# Patient Record
Sex: Female | Born: 1975 | Hispanic: No | Marital: Married | Smoking: Never smoker
Health system: Southern US, Community
[De-identification: ages and names within clinical notes are randomized; demographics above are authoritative.]

---

## 2018-03-05 ENCOUNTER — Emergency Department (HOSPITAL_COMMUNITY): Payer: BLUE CROSS/BLUE SHIELD

## 2018-03-05 ENCOUNTER — Observation Stay (HOSPITAL_COMMUNITY)
Admission: EM | Admit: 2018-03-05 | Discharge: 2018-03-07 | Disposition: A | Discharge status: Home / Self Care | Payer: BLUE CROSS/BLUE SHIELD | Attending: Internal Medicine | Admitting: Internal Medicine

## 2018-03-05 DIAGNOSIS — F418 Other specified anxiety disorders: Secondary | ICD-10-CM | POA: Insufficient documentation

## 2018-03-05 DIAGNOSIS — R109 Unspecified abdominal pain: Secondary | ICD-10-CM | POA: Diagnosis not present

## 2018-03-05 DIAGNOSIS — R7989 Other specified abnormal findings of blood chemistry: Secondary | ICD-10-CM | POA: Diagnosis not present

## 2018-03-05 DIAGNOSIS — R1013 Epigastric pain: Secondary | ICD-10-CM | POA: Diagnosis not present

## 2018-03-05 DIAGNOSIS — R55 Syncope and collapse: Secondary | ICD-10-CM | POA: Diagnosis not present

## 2018-03-05 DIAGNOSIS — R778 Other specified abnormalities of plasma proteins: Secondary | ICD-10-CM

## 2018-03-05 NOTE — ED Triage Notes (Signed)
Pt presents to ED via [facility] for syncope. Pt was with family and passed out outside a shopping center. Pt has been responsive to voice, but not talking.
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
## 2018-03-06 ENCOUNTER — Observation Stay (HOSPITAL_COMMUNITY): Payer: BLUE CROSS/BLUE SHIELD

## 2018-03-06 ENCOUNTER — Emergency Department (HOSPITAL_COMMUNITY): Payer: BLUE CROSS/BLUE SHIELD

## 2018-03-06 ENCOUNTER — Other Ambulatory Visit: Payer: Self-pay

## 2018-03-06 ENCOUNTER — Encounter (HOSPITAL_COMMUNITY): Payer: Self-pay

## 2018-03-06 DIAGNOSIS — R55 Syncope and collapse: Secondary | ICD-10-CM | POA: Diagnosis not present

## 2018-03-06 DIAGNOSIS — F418 Other specified anxiety disorders: Secondary | ICD-10-CM | POA: Diagnosis not present

## 2018-03-06 DIAGNOSIS — R748 Abnormal levels of other serum enzymes: Secondary | ICD-10-CM

## 2018-03-06 DIAGNOSIS — R7989 Other specified abnormal findings of blood chemistry: Secondary | ICD-10-CM | POA: Diagnosis not present

## 2018-03-06 DIAGNOSIS — R1013 Epigastric pain: Secondary | ICD-10-CM | POA: Diagnosis not present

## 2018-03-06 DIAGNOSIS — R109 Unspecified abdominal pain: Secondary | ICD-10-CM | POA: Diagnosis not present

## 2018-03-06 DIAGNOSIS — R778 Other specified abnormalities of plasma proteins: Secondary | ICD-10-CM | POA: Insufficient documentation

## 2018-03-06 LAB — CBC WITH DIFFERENTIAL/PLATELET
BASOS ABS: 0 10*3/uL (ref 0.0–0.1)
BASOS PCT: 0 %
EOS ABS: 0.2 10*3/uL (ref 0.0–0.7)
EOS PCT: 3 %
HEMATOCRIT: 38.3 % (ref 36.0–46.0)
Hemoglobin: 12.8 g/dL (ref 12.0–15.0)
Lymphocytes Relative: 24 %
Lymphs Abs: 1.9 10*3/uL (ref 0.7–4.0)
MCH: 27.4 pg (ref 26.0–34.0)
MCHC: 33.4 g/dL (ref 30.0–36.0)
MCV: 82 fL (ref 78.0–100.0)
MONO ABS: 0.4 10*3/uL (ref 0.1–1.0)
MONOS PCT: 5 %
NEUTROS ABS: 5.2 10*3/uL (ref 1.7–7.7)
Neutrophils Relative %: 68 %
PLATELETS: 286 10*3/uL (ref 150–400)
RBC: 4.67 MIL/uL (ref 3.87–5.11)
RDW: 14.1 % (ref 11.5–15.5)
WBC: 7.7 10*3/uL (ref 4.0–10.5)

## 2018-03-06 LAB — D-DIMER, QUANTITATIVE: D-Dimer, Quant: <0.27 ug/mL-FEU (ref 0.00–0.50)

## 2018-03-06 LAB — HEPATIC FUNCTION PANEL
ALT: 21 U/L (ref 0–44)
AST: 17 U/L (ref 15–41)
Albumin: 3.9 g/dL (ref 3.5–5.0)
Alkaline Phosphatase: 80 U/L (ref 38–126)
Bilirubin, Direct: <0.1 mg/dL (ref 0.0–0.2)
TOTAL PROTEIN: 7 g/dL (ref 6.5–8.1)
Total Bilirubin: 0.5 mg/dL (ref 0.3–1.2)

## 2018-03-06 LAB — ECHOCARDIOGRAM COMPLETE
Height: 65 in
Weight: 3494.4 oz

## 2018-03-06 LAB — I-STAT CHEM 8, ED
BUN: 7 mg/dL (ref 6–20)
Calcium, Ion: 1.14 mmol/L — ABNORMAL LOW (ref 1.15–1.40)
Chloride: 107 mmol/L (ref 98–111)
Creatinine, Ser: 0.5 mg/dL (ref 0.44–1.00)
Glucose, Bld: 103 mg/dL — ABNORMAL HIGH (ref 70–99)
HEMATOCRIT: 37 % (ref 36.0–46.0)
HEMOGLOBIN: 12.6 g/dL (ref 12.0–15.0)
POTASSIUM: 3.5 mmol/L (ref 3.5–5.1)
SODIUM: 142 mmol/L (ref 135–145)
TCO2: 22 mmol/L (ref 22–32)

## 2018-03-06 LAB — TROPONIN I: Troponin I: <0.03 ng/mL (ref <0.03)

## 2018-03-06 LAB — I-STAT TROPONIN, ED
Troponin i, poc: 0.02 ng/mL (ref 0.00–0.08)
Troponin i, poc: 0.16 ng/mL (ref 0.00–0.08)

## 2018-03-06 LAB — I-STAT BETA HCG BLOOD, ED (MC, WL, AP ONLY): I-stat hCG, quantitative: <5 m[IU]/mL (ref <5)

## 2018-03-06 LAB — LIPASE, BLOOD: Lipase: 50 U/L (ref 11–51)

## 2018-03-06 MED ORDER — ONDANSETRON HCL 4 MG PO TABS: 4.0000 mg | ORAL_TABLET | Freq: Four times a day (QID) | ORAL | Status: DC | PRN

## 2018-03-06 MED ORDER — FENTANYL CITRATE (PF) 100 MCG/2ML IJ SOLN
50.0000 ug | Freq: Once | INTRAMUSCULAR | Status: AC
Start: 2018-03-06 — End: 2018-03-06
  Administered 2018-03-06: 50 ug via INTRAVENOUS
  Filled 2018-03-06: qty 2

## 2018-03-06 MED ORDER — SODIUM CHLORIDE 0.9 % IV SOLN
INTRAVENOUS | Status: DC
  Administered 2018-03-06: 75 mL/h via INTRAVENOUS

## 2018-03-06 MED ORDER — PANTOPRAZOLE SODIUM 40 MG PO TBEC
40.0000 mg | DELAYED_RELEASE_TABLET | Freq: Two times a day (BID) | ORAL | Status: DC
  Administered 2018-03-06 – 2018-03-07 (×3): 40 mg via ORAL
  Filled 2018-03-06 (×3): qty 1

## 2018-03-06 MED ORDER — SUCRALFATE 1 GM/10ML PO SUSP
1.0000 g | Freq: Three times a day (TID) | ORAL | Status: DC
  Filled 2018-03-06 (×3): qty 10

## 2018-03-06 MED ORDER — ASPIRIN EC 81 MG PO TBEC
81.0000 mg | DELAYED_RELEASE_TABLET | Freq: Every day | ORAL | Status: DC
  Administered 2018-03-07: 81 mg via ORAL
  Filled 2018-03-06: qty 1

## 2018-03-06 MED ORDER — ACETAMINOPHEN 650 MG RE SUPP: 650.0000 mg | Freq: Four times a day (QID) | RECTAL | Status: DC | PRN

## 2018-03-06 MED ORDER — IOPAMIDOL (ISOVUE-370) INJECTION 76%
INTRAVENOUS | Status: AC
Start: 2018-03-06 — End: 2018-03-06
  Filled 2018-03-06: qty 100

## 2018-03-06 MED ORDER — ONDANSETRON HCL 4 MG/2ML IJ SOLN
4.0000 mg | Freq: Once | INTRAMUSCULAR | Status: AC
  Administered 2018-03-06: 4 mg via INTRAVENOUS
  Filled 2018-03-06: qty 2

## 2018-03-06 MED ORDER — SODIUM CHLORIDE 0.9 % IV BOLUS: 1000.0000 mL | Freq: Once | INTRAVENOUS | Status: DC

## 2018-03-06 MED ORDER — ONDANSETRON HCL 4 MG/2ML IJ SOLN: 4.0000 mg | Freq: Four times a day (QID) | INTRAMUSCULAR | Status: DC | PRN

## 2018-03-06 MED ORDER — ENOXAPARIN SODIUM 40 MG/0.4ML ~~LOC~~ SOLN
40.0000 mg | SUBCUTANEOUS | Status: DC
  Filled 2018-03-06: qty 0.4

## 2018-03-06 MED ORDER — ACETAMINOPHEN 325 MG PO TABS: 650.0000 mg | ORAL_TABLET | Freq: Four times a day (QID) | ORAL | Status: DC | PRN

## 2018-03-06 MED ORDER — SODIUM CHLORIDE 0.9 % IV BOLUS
1000.0000 mL | Freq: Once | INTRAVENOUS | Status: AC
  Administered 2018-03-06: 1000 mL via INTRAVENOUS

## 2018-03-06 MED ORDER — IOPAMIDOL (ISOVUE-370) INJECTION 76%
100.0000 mL | Freq: Once | INTRAVENOUS | Status: AC | PRN
  Administered 2018-03-06: 100 mL via INTRAVENOUS

## 2018-03-06 NOTE — Progress Notes (Signed)
PT lying in bed, declines food and medicine. Won't get up in chair,unable to obtain orthostatics, pts husband stating she want to go home. MD to come and talk to pt. Alicia SoursAngela Tristen Luce

## 2018-03-06 NOTE — ED Notes (Signed)
Patient transported to CT 
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
## 2018-03-06 NOTE — Progress Notes (Signed)
PROGRESS NOTE    Alicia Anderson  RUE:[id number] DOB: [birth year] DOA: 03/05/2018 PCP: Yolanda Bonine, MD    Brief Narrative:  42 year old female who presented with syncope.  She does not have any significant past medical history.  She syncope episode while walking (10:00 pm), she had prodromal dizziness, no loss of continence or seizure-like activities during the event.  She was approximately 15 to 20 minutes unconsciousness.   She had a similar event as ago while standing while she had a fainting episode which was brief in duration compared to the one suffered on the day of admission.  Reports 3 days of worsening epigastric abdominal pain which has been constant sharp and burning in nature, moderate to severe intensity, worse with meals.  This has lead to significant decreased p.o. intake over the last 3 days.  She has mentions significant psychological stress at home over the last few months.   Patient does not formally exercise but is able to do her routine activities without any angina.  Walks at a normal pace, goes to the grocery store and climb steps without any symptoms.  On the initial physical examination blood pressure 146/88, rate 64, respirate 18, temperature 98.2, oxygen saturation 95%.  His membranes, lungs clear to auscultation bilaterally, heart S1-S2 present rhythmic, abdomen soft nontender, no lower extremity edema.  Sodium 142, potassium 3.5, chloride 107, glucose 103, BUN 7, creatinine 0.50, troponin 0.16, white count 7.7, hemoglobin 12.8, hematocrit 38.3, platelets 286, d-dimer less than 0.27.  CT chest and abdomen no acute intrathoracic, abdominal or pelvic pathology, no aortic aneurysm or dissection, no evidence of pulmonary embolus.  Chest x-ray negative for infiltrates.  EKG normal sinus rhythm, normal axis, normal intervals, no ST elevations or ST depressions, no significant T wave abnormalities.  Patient was admitted to the hospital working diagnosis of syncope complicated  by elevated troponin.  Assessment & Plan:   Principal Problem:   Syncope and collapse Active Problems:   Epigastric pain  1. Syncope complicated by elevated troponin (point of care). No further syncope episodes. Had prodromal dizziness before the event, had significant decreased po intake over last 3 days due to abdominal pain. She is physically active at home with no angina or heart failure symptoms. Increased phycological stress at home. EKG with normal sinus rhythm, with normal intervals and no Brugada criteria. Possible neurocardiogenic (vasovagal syncope), but elevated troponin warrants further testing with echocardiography and continue telemetry monitoring. Out of bed, ambulation and orthostatic vitals.   2. Abdominal pain due to gastritis. Will add antiacid therapy with bid proton pump inhibitors, sucralfate before meals, and advance diet as tolerated.  3. Anxiety and possible depression. Will need out patient follow up, no confusion or agitation.    DVT prophylaxis: enoxaparin   Code Status:  full Family Communication: I spoke with patient's husband at the bedside and all questions were addressed.  Disposition Plan/ discharge barriers: Plan to dc home in am if cardiac workup negative    Consultants:     Procedures:     Antimicrobials:       Subjective: Patient is feeling better, moderate abdominal pain in the epigastrium, no nausea or vomiting, no chest pain or dyspnea.   Objective: Vitals:   03/06/18 0400 03/06/18 0430 03/06/18 0600 03/06/18 0608  BP: 120/83 118/83  129/87  Pulse: 64 62  65  Resp: 19 14  (!) 22  Temp:    97.7 F (36.5 C)  TempSrc:    Oral  SpO2: 93% 92%  96%  Weight:   99.1 kg (218 lb 6.4 oz)   Height:   5\' 5"  (1.651 m)    No intake or output data in the 24 hours ending 03/06/18 0834 Filed Weights   03/06/18 0003 03/06/18 0600  Weight: 97.1 kg (214 lb) 99.1 kg (218 lb 6.4 oz)    Examination:   General: Not in pain or dyspnea,  deconditioned  Neurology: Awake and alert, non focal  E ENT: no pallor, no icterus, oral mucosa moist Cardiovascular: No JVD. S1-S2 present, rhythmic, no gallops, rubs, or murmurs. No lower extremity edema. Pulmonary: vesicular breath sounds bilaterally, adequate air movement, no wheezing, rhonchi or rales. Gastrointestinal. Abdomen with mild distention, no organomegaly, non tender, no rebound or guarding Skin. No rashes Musculoskeletal: no joint deformities     Data Reviewed: I have personally reviewed following labs and imaging studies  CBC: Recent Labs  Lab 03/06/18 0007 03/06/18 0014  WBC 7.7  --   NEUTROABS 5.2  --   HGB 12.8 12.6  HCT 38.3 37.0  MCV 82.0  --   PLT 286  --    Basic Metabolic Panel: Recent Labs  Lab 03/06/18 0014  NA 142  K 3.5  CL 107  GLUCOSE 103*  BUN 7  CREATININE 0.50   GFR: Estimated Creatinine Clearance: 107.8 mL/min (by C-G formula based on SCr of 0.5 mg/dL). Liver Function Tests: Recent Labs  Lab 03/06/18 0007  AST 17  ALT 21  ALKPHOS 80  BILITOT 0.5  PROT 7.0  ALBUMIN 3.9   Recent Labs  Lab 03/06/18 0007  LIPASE 50   No results for input(s): AMMONIA in the last 168 hours. Coagulation Profile: No results for input(s): INR, PROTIME in the last 168 hours. Cardiac Enzymes: No results for input(s): CKTOTAL, CKMB, CKMBINDEX, TROPONINI in the last 168 hours. BNP (last 3 results) No results for input(s): PROBNP in the last 8760 hours. HbA1C: No results for input(s): HGBA1C in the last 72 hours. CBG: No results for input(s): GLUCAP in the last 168 hours. Lipid Profile: No results for input(s): CHOL, HDL, LDLCALC, TRIG, CHOLHDL, LDLDIRECT in the last 72 hours. Thyroid Function Tests: No results for input(s): TSH, T4TOTAL, FREET4, T3FREE, THYROIDAB in the last 72 hours. Anemia Panel: No results for input(s): VITAMINB12, FOLATE, FERRITIN, TIBC, IRON, RETICCTPCT in the last 72 hours.    Radiology Studies: I have reviewed  all of the imaging during this hospital visit personally     Scheduled Meds: . aspirin EC  81 mg Oral Daily  . enoxaparin (LOVENOX) injection  40 mg Subcutaneous Q24H  . iopamidol       Continuous Infusions: . sodium chloride 75 mL/hr (03/06/18 0708)     LOS: 0 days        Sherlie Boyum Annett Gulaaniel Yajaira Doffing, MD [facility] Pager [phone]
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
## 2018-03-06 NOTE — Progress Notes (Signed)
  Echocardiogram 2D Echocardiogram has been performed.  Delcie RochENNINGTON, Omeed Osuna 03/06/2018, 4:51 PM
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
## 2018-03-06 NOTE — ED Notes (Signed)
Notified EDP,Palumbo,MD., pt. I-stat troponin results 0.16 and RN,Sullivan made aware.

## 2018-03-07 DIAGNOSIS — R55 Syncope and collapse: Secondary | ICD-10-CM | POA: Diagnosis not present

## 2018-03-07 DIAGNOSIS — R1013 Epigastric pain: Secondary | ICD-10-CM | POA: Diagnosis not present

## 2018-03-07 LAB — BASIC METABOLIC PANEL
Anion gap: 8 (ref 5–15)
BUN: <5 mg/dL — ABNORMAL LOW (ref 6–20)
CALCIUM: 8.5 mg/dL — AB (ref 8.9–10.3)
CO2: 24 mmol/L (ref 22–32)
CREATININE: 0.55 mg/dL (ref 0.44–1.00)
Chloride: 111 mmol/L (ref 98–111)
Glucose, Bld: 91 mg/dL (ref 70–99)
Potassium: 3.6 mmol/L (ref 3.5–5.1)
SODIUM: 143 mmol/L (ref 135–145)

## 2018-03-07 LAB — TROPONIN I: Troponin I: <0.03 ng/mL (ref <0.03)

## 2018-03-07 MED ORDER — SUCRALFATE 1 GM/10ML PO SUSP: 1.0000 g | Freq: Three times a day (TID) | ORAL | 0 refills | Status: AC

## 2018-03-07 MED ORDER — PANTOPRAZOLE SODIUM 40 MG PO TBEC: 40.0000 mg | DELAYED_RELEASE_TABLET | Freq: Every day | ORAL | 0 refills | Status: AC

## 2018-03-07 NOTE — Progress Notes (Signed)
Pt discharged in stable condition via wheelchair into the care of her husband via private vehicle.  PIV removed intact w/o S&S of complications.  Discharge instructions reviewed with pt/husband.  Pt/husband verbalized understanding. 
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
## 2018-03-07 NOTE — Plan of Care (Signed)
Adequate for discharge.

## 2020-03-10 IMAGING — CT CT ANGIO CHEST
5 of 12 series · 18 of 46 positions shown · IV contrast (ISOVUE)
Comparison: Chest radiograph dated 03/05/2018

CLINICAL DATA: 42-year-old female with syncope.

EXAM:
CT ANGIOGRAPHY CHEST
CT ABDOMEN AND PELVIS WITH CONTRAST
TECHNIQUE: Multidetector CT imaging of the chest was performed using the
standard protocol during bolus administration of intravenous
contrast. Multiplanar CT image reconstructions and MIPs were
obtained to evaluate the vascular anatomy. Multidetector CT imaging
of the abdomen and pelvis was performed using the standard protocol
during bolus administration of intravenous contrast.
CONTRAST:  100mL [id number] IOPAMIDOL ([id number]) INJECTION 76%

[Series 4: axial st · axial · 0.83mm/px · z∈[-706,-421]mm · 4 of 96 slices shown (1 of 2)]
[im 20/96  lung]
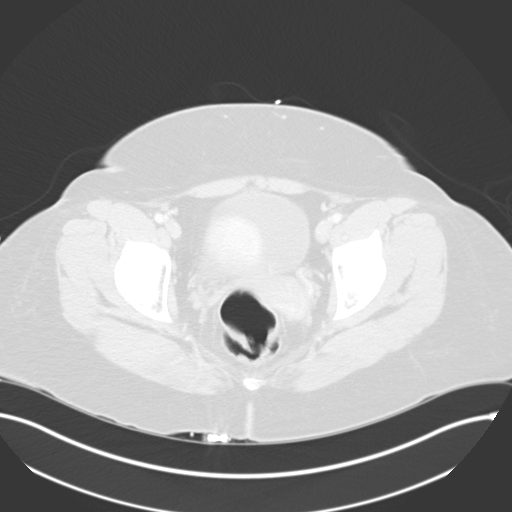
[im 39/96  soft-tissue]
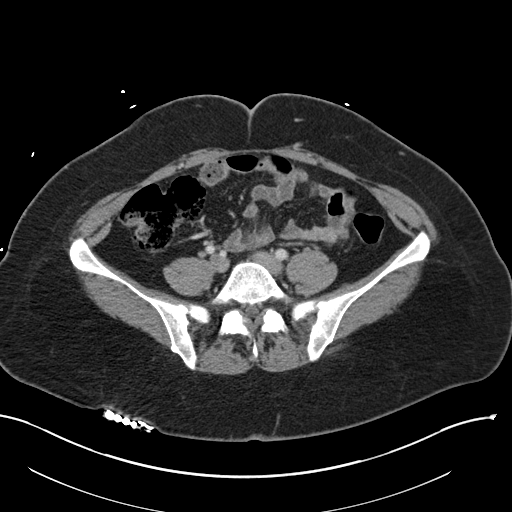
[im 58/96  lung]
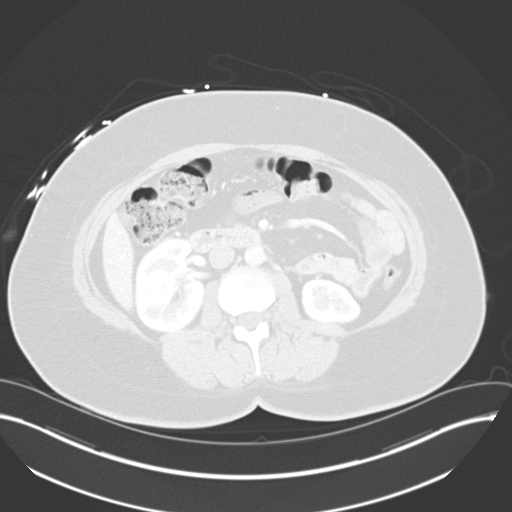
[im 77/96  soft-tissue]
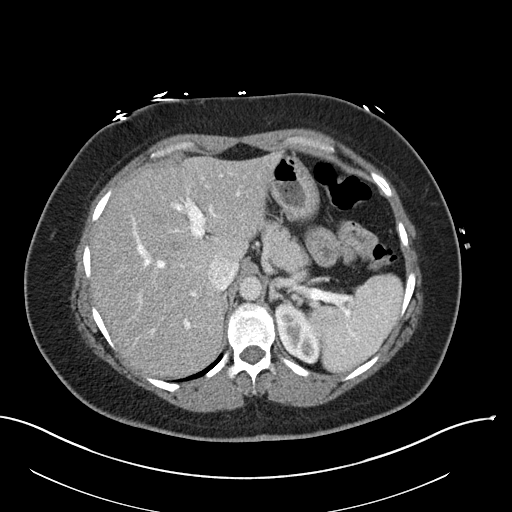

[Series 6: axial st · axial · 0.71mm/px · z∈[-348,-255]mm · 2 of 94 slices shown (2 of 2)]
[im 32/94  lung]
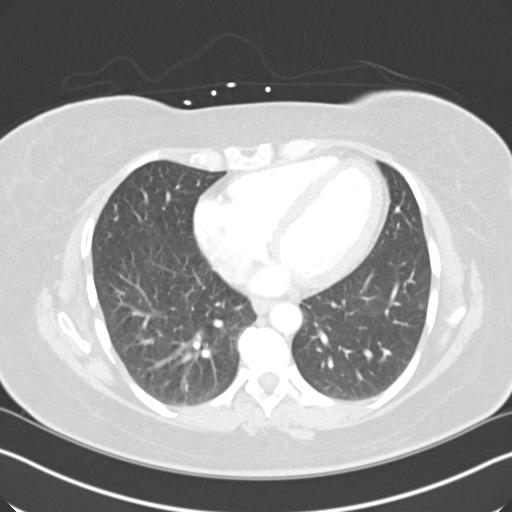
[im 63/94  lung]
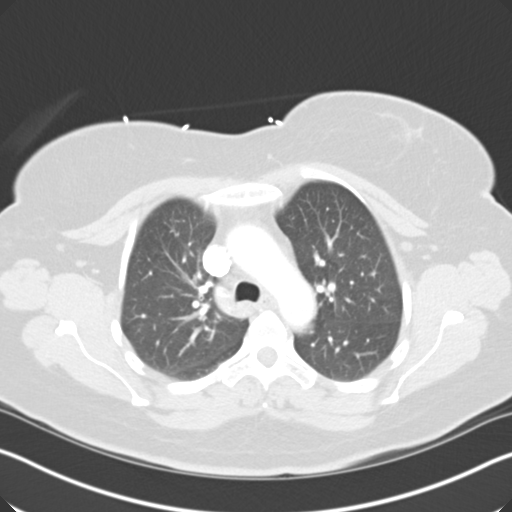

[Series 7: thins · axial · 0.71mm/px · z∈[-418,-186]mm · 8 of 280 slices shown]
[im 24/280  lung]
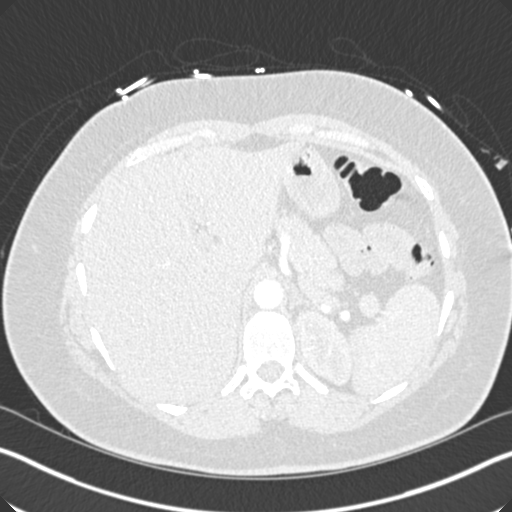
[im 70/280  lung]
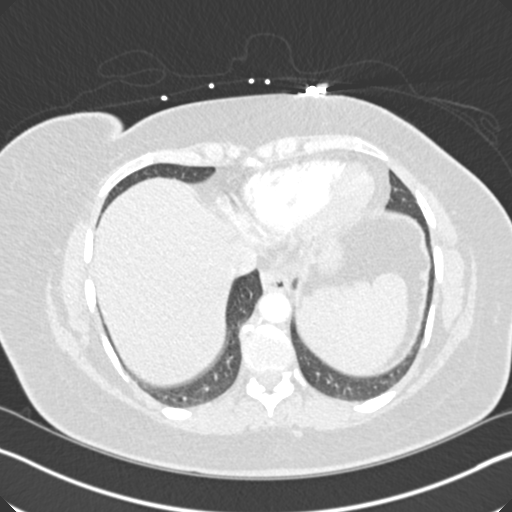
[im 94/280  lung]
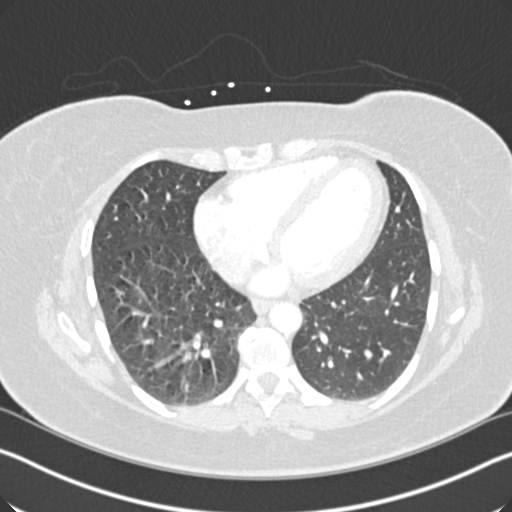
[im 117/280  lung]
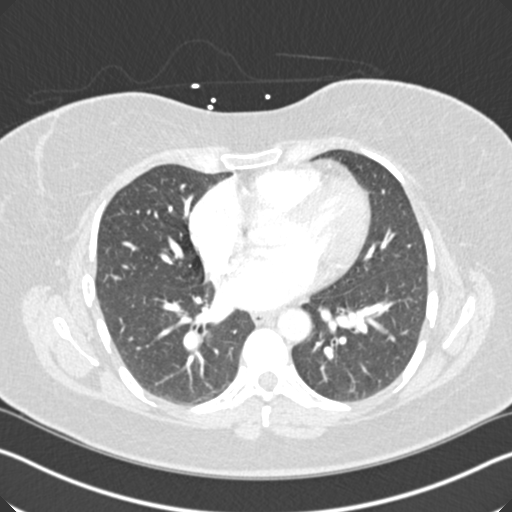
[im 163/280  lung]
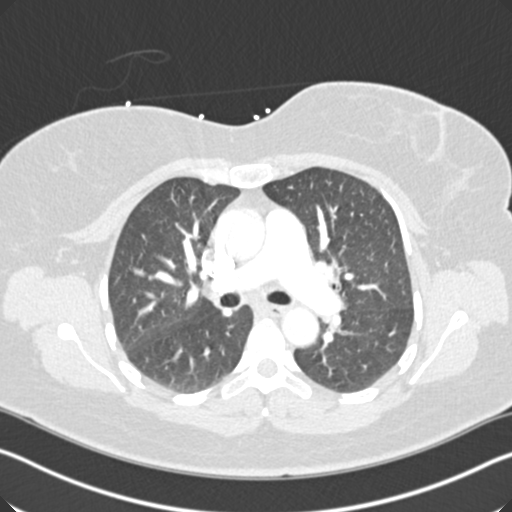
[im 187/280  lung]
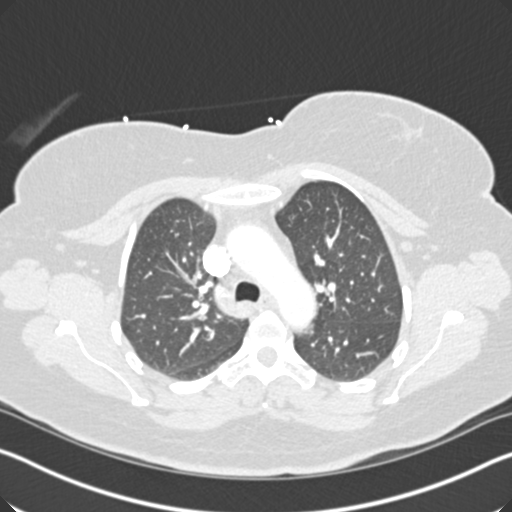
[im 210/280  lung]
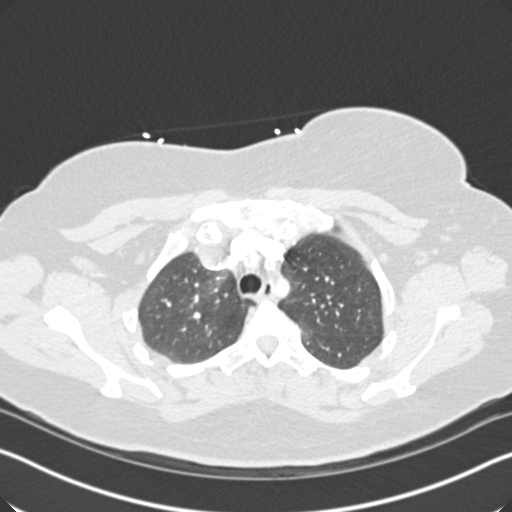
[im 256/280  lung]
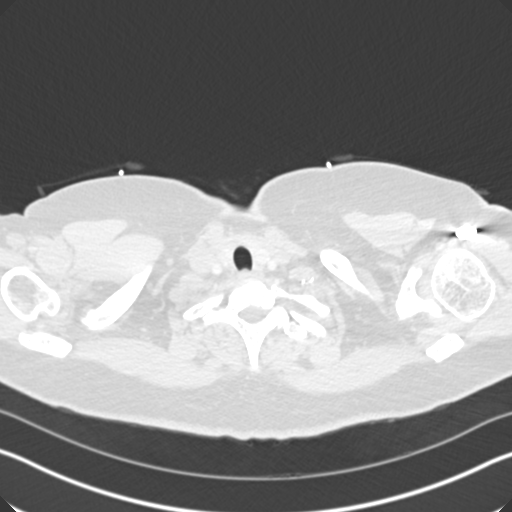

[Series 8: lung · axial · 0.55mm/px · z∈[-350,-238]mm · 3 of 113 slices shown]
[im 29/113  soft-tissue]
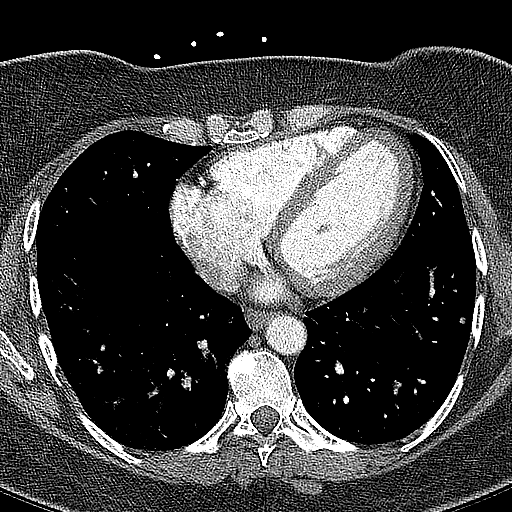
[im 57/113  soft-tissue]
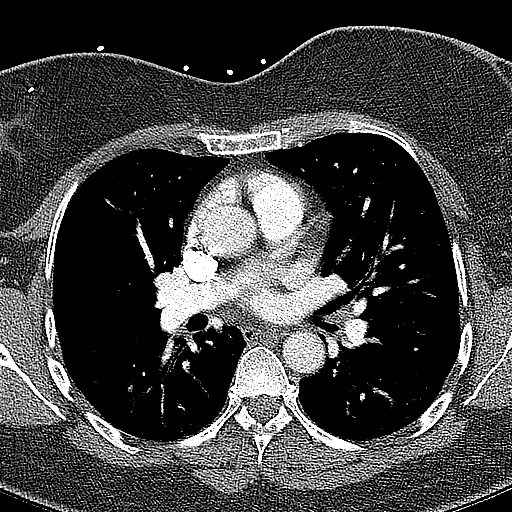
[im 85/113  soft-tissue]
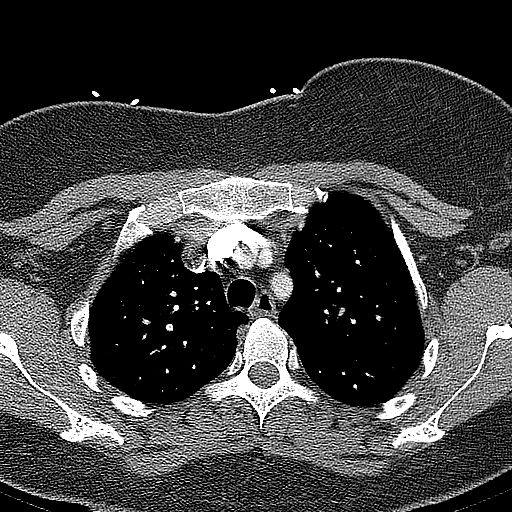

[Series 9: coronal mpr · coronal · 0.57mm/px · 1 of 148 slices shown]
[im 74/148  soft-tissue]
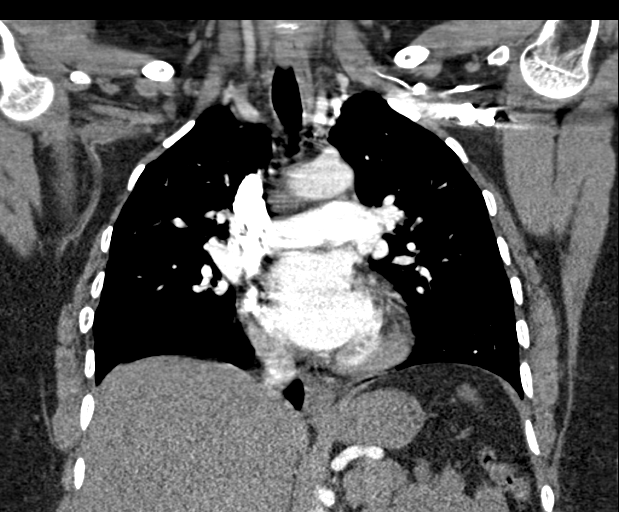

[18 of 46 positions shown; findings below may reference images not displayed]

FINDINGS: Evaluation of this exam is limited due to respiratory motion
artifact.

CTA CHEST FINDINGS

Cardiovascular: Borderline cardiomegaly. No pericardial effusion.
The thoracic aorta is unremarkable. No CT evidence of pulmonary
embolism.

Mediastinum/Nodes: No hilar or mediastinal adenopathy. Esophagus and
the thyroid gland are grossly unremarkable. No mediastinal fluid
collection.

Lungs/Pleura: There is a 4 mm left lower lobe nodule (series 8,
image 85). The lungs are clear. There is no pleural effusion or
pneumothorax. The central airways are patent.

Musculoskeletal: No chest wall abnormality. No acute or significant
osseous findings.

Review of the MIP images confirms the above findings.

CT ABDOMEN and PELVIS FINDINGS

No intra-abdominal free air or free fluid.

Hepatobiliary: No focal liver abnormality is seen. No gallstones,
gallbladder wall thickening, or biliary dilatation.

Pancreas: Unremarkable. No pancreatic ductal dilatation or
surrounding inflammatory changes.

Spleen: Normal in size without focal abnormality.

Adrenals/Urinary Tract: The adrenal glands are unremarkable.
Subcentimeter left renal exophytic hypodense lesions are too small
to characterize. There is no hydronephrosis on either side. There is
symmetric enhancement and excretion of contrast by both kidneys. The
visualized ureters and urinary bladder appear unremarkable.

Stomach/Bowel: There is no bowel obstruction or active inflammation.
Normal caliber fecalized loops of small bowel in the mid abdomen may
represent increased transit time or small intestine bacterial
overgrowth. The appendix is normal.

Vascular/Lymphatic: No significant vascular findings are present. No
enlarged abdominal or pelvic lymph nodes.

Reproductive: Uterus and bilateral adnexa are unremarkable.

Other: Small fat containing umbilical hernia.

Musculoskeletal: No acute or significant osseous findings.

Review of the MIP images confirms the above findings.
IMPRESSION: No acute intrathoracic, abdominal, or pelvic pathology. No aortic
aneurysm or dissection. No CT evidence of pulmonary embolism.
